# Patient Record
Sex: Female | Born: 1975 | Marital: Single | Smoking: Never smoker
Health system: Southern US, Community
[De-identification: ages and names within clinical notes are randomized; demographics above are authoritative.]

## PROBLEM LIST (undated history)

## (undated) DIAGNOSIS — N920 Excessive and frequent menstruation with regular cycle: Secondary | ICD-10-CM

## (undated) DIAGNOSIS — N83209 Unspecified ovarian cyst, unspecified side: Secondary | ICD-10-CM

## (undated) HISTORY — PX: LAPAROSCOPIC ABDOMINAL EXPLORATION: SHX6249

## (undated) HISTORY — DX: Unspecified ovarian cyst, unspecified side: N83.209

## (undated) HISTORY — DX: Excessive and frequent menstruation with regular cycle: N92.0

---

## 2014-06-13 ENCOUNTER — Inpatient Hospital Stay (HOSPITAL_COMMUNITY)
Admission: AD | Admit: 2014-06-13 | Discharge: 2014-06-13 | Disposition: A | Discharge status: Home / Self Care | Payer: Self-pay | Source: Ambulatory Visit | Attending: Family Medicine | Admitting: Family Medicine

## 2014-06-13 ENCOUNTER — Encounter (HOSPITAL_COMMUNITY): Payer: Self-pay | Admitting: *Deleted

## 2014-06-13 ENCOUNTER — Inpatient Hospital Stay (HOSPITAL_COMMUNITY): Payer: Self-pay

## 2014-06-13 DIAGNOSIS — Z9851 Tubal ligation status: Secondary | ICD-10-CM | POA: Insufficient documentation

## 2014-06-13 DIAGNOSIS — R1031 Right lower quadrant pain: Secondary | ICD-10-CM

## 2014-06-13 DIAGNOSIS — N83201 Unspecified ovarian cyst, right side: Secondary | ICD-10-CM

## 2014-06-13 DIAGNOSIS — N8320 Unspecified ovarian cysts: Secondary | ICD-10-CM | POA: Insufficient documentation

## 2014-06-13 DIAGNOSIS — N920 Excessive and frequent menstruation with regular cycle: Secondary | ICD-10-CM | POA: Insufficient documentation

## 2014-06-13 LAB — CBC WITH DIFFERENTIAL/PLATELET
BASOS ABS: 0 10*3/uL (ref 0.0–0.1)
Basophils Relative: 1 % (ref 0–1)
Eosinophils Absolute: 0.4 10*3/uL (ref 0.0–0.7)
Eosinophils Relative: 5 % (ref 0–5)
HEMATOCRIT: 40.4 % (ref 36.0–46.0)
HEMOGLOBIN: 13.9 g/dL (ref 12.0–15.0)
LYMPHS PCT: 31 % (ref 12–46)
Lymphs Abs: 2.7 10*3/uL (ref 0.7–4.0)
MCH: 30.3 pg (ref 26.0–34.0)
MCHC: 34.4 g/dL (ref 30.0–36.0)
MCV: 88 fL (ref 78.0–100.0)
MONOS PCT: 8 % (ref 3–12)
Monocytes Absolute: 0.7 10*3/uL (ref 0.1–1.0)
NEUTROS PCT: 55 % (ref 43–77)
Neutro Abs: 4.8 10*3/uL (ref 1.7–7.7)
Platelets: 227 10*3/uL (ref 150–400)
RBC: 4.59 MIL/uL (ref 3.87–5.11)
RDW: 13.4 % (ref 11.5–15.5)
WBC: 8.6 10*3/uL (ref 4.0–10.5)

## 2014-06-13 LAB — URINALYSIS, ROUTINE W REFLEX MICROSCOPIC
Bilirubin Urine: NEGATIVE
Glucose, UA: NEGATIVE mg/dL
Hgb urine dipstick: NEGATIVE
Ketones, ur: NEGATIVE mg/dL
Leukocytes, UA: NEGATIVE
NITRITE: NEGATIVE
Protein, ur: NEGATIVE mg/dL
SPECIFIC GRAVITY, URINE: 1.02 (ref 1.005–1.030)
UROBILINOGEN UA: 0.2 mg/dL (ref 0.0–1.0)
pH: 6 (ref 5.0–8.0)

## 2014-06-13 LAB — WET PREP, GENITAL
TRICH WET PREP: NONE SEEN
Yeast Wet Prep HPF POC: NONE SEEN

## 2014-06-13 LAB — POCT PREGNANCY, URINE: Preg Test, Ur: NEGATIVE

## 2014-06-13 MED ORDER — ONDANSETRON HCL 4 MG PO TABS
ORAL_TABLET | ORAL | Status: AC
  Administered 2014-06-13: 8 mg
  Filled 2014-06-13: qty 1

## 2014-06-13 MED ORDER — ONDANSETRON HCL 4 MG PO TABS
ORAL_TABLET | ORAL | Status: AC
  Filled 2014-06-13: qty 1

## 2014-06-13 MED ORDER — ONDANSETRON HCL 4 MG PO TABS: 4.0000 mg | ORAL_TABLET | Freq: Four times a day (QID) | ORAL | Status: AC

## 2014-06-13 MED ORDER — ONDANSETRON 8 MG PO TBDP: 8.0000 mg | ORAL_TABLET | Freq: Once | ORAL | Status: DC

## 2014-06-13 MED ORDER — HYDROMORPHONE HCL 1 MG/ML IJ SOLN
1.0000 mg | Freq: Once | INTRAMUSCULAR | Status: AC
  Administered 2014-06-13: 1 mg via INTRAMUSCULAR
  Filled 2014-06-13: qty 1

## 2014-06-13 MED ORDER — OXYCODONE-ACETAMINOPHEN 5-325 MG PO TABS: 1.0000 | ORAL_TABLET | Freq: Four times a day (QID) | ORAL | Status: AC | PRN

## 2014-06-13 NOTE — MAU Provider Note (Signed)
History     CSN: [id number]  Arrival date and time: 06/13/14 1736   First Provider Initiated Contact with Patient 06/13/14 1946      Chief Complaint  Patient presents with  . Abdominal Pain  . Vaginal Bleeding  . Vaginal Itching   HPI  [name] is a 39 y.o. [id number] who presents to MAU today with complaint of suprapubic pain. She rates her pain at 9/10 now. She states a history of heavy periods lasting 8-10 days on average monthly since she had BTL in [facility] 06/08/13. She denies vaginal bleeding today. She states LMP was 05/28/14-06/02/14. She denies N/V/D or constipation, dysuria or fever.   OB History    Gravida Para Term Preterm AB TAB SAB Ectopic Multiple Living   0 1 0 1 0  4      History reviewed. No pertinent past medical history.  Past Surgical History  Procedure Laterality Date  . Cesarean section    . Laparoscopic abdominal exploration      ONE  TWIN WAS ECTOPIC    History reviewed. No pertinent family history.  History  Substance Use Topics  . Smoking status: Never Smoker   . Smokeless tobacco: Not on file  . Alcohol Use: No    Allergies: No Known Allergies  Prescriptions prior to admission  Medication Sig Dispense Refill Last Dose  . albuterol (PROVENTIL HFA;VENTOLIN HFA) 108 (90 BASE) MCG/ACT inhaler Inhale 2 puffs into the lungs every 6 (six) hours as needed for wheezing or shortness of breath.   Rescue    Review of Systems  Constitutional: Negative for fever and malaise/fatigue.  Gastrointestinal: Positive for abdominal pain. Negative for nausea, vomiting, diarrhea and constipation.  Genitourinary: Negative for dysuria and urgency.       + vaginal discharge Neg - vaginal bleeding   Physical Exam   Blood pressure 139/98, pulse 76, temperature 98.3 F (36.8 C), temperature source Oral, resp. rate 20, weight 179 lb (81.194 kg), last menstrual period 05/28/2014.  Physical Exam  Constitutional: She is oriented to person,  place, and time. She appears well-developed and well-nourished. No distress.  HENT:  Head: Normocephalic.  Cardiovascular: Normal rate.   Respiratory: Effort normal.  GI: Soft. Bowel sounds are normal. She exhibits no distension and no mass. There is tenderness (moderate tenderness to palpation of the lower abdomen more prominent in the RLQ). There is no rebound and no guarding.  Genitourinary: Uterus is not enlarged and not tender. Cervix exhibits no motion tenderness, no discharge and no friability. Right adnexum displays tenderness. Right adnexum displays no mass. Left adnexum displays no mass and no tenderness. No bleeding in the vagina. Vaginal discharge (small amount of thin, white discharge noted) found.  Neurological: She is alert and oriented to person, place, and time.  Skin: Skin is warm and dry. No erythema.  Psychiatric: She has a normal mood and affect.   Results for orders placed or performed during the hospital encounter of 06/13/14 (from the past 24 hour(s))  Urinalysis, Routine w reflex microscopic     Status: None   Collection Time: 06/13/14  6:30 PM  Result Value Ref Range   Color, Urine YELLOW YELLOW   APPearance CLEAR CLEAR   Specific Gravity, Urine 1.020 1.005 - 1.030   pH 6.0 5.0 - 8.0   Glucose, UA NEGATIVE NEGATIVE mg/dL   Hgb urine dipstick NEGATIVE NEGATIVE   Bilirubin Urine NEGATIVE NEGATIVE   Ketones, ur NEGATIVE NEGATIVE mg/dL   Protein,  ur NEGATIVE NEGATIVE mg/dL   Urobilinogen, UA 0.2 0.0 - 1.0 mg/dL   Nitrite NEGATIVE NEGATIVE   Leukocytes, UA NEGATIVE NEGATIVE  Pregnancy, urine POC     Status: None   Collection Time: 06/13/14  6:45 PM  Result Value Ref Range   Preg Test, Ur NEGATIVE NEGATIVE  CBC with Differential     Status: None   Collection Time: 06/13/14  6:58 PM  Result Value Ref Range   WBC 8.6 4.0 - 10.5 K/uL   RBC 4.59 3.87 - 5.11 MIL/uL   Hemoglobin 13.9 12.0 - 15.0 g/dL   HCT 16.1 09.6 - 04.5 %   MCV 88.0 78.0 - 100.0 fL   MCH 30.3  26.0 - 34.0 pg   MCHC 34.4 30.0 - 36.0 g/dL   RDW 40.9 81.1 - 91.4 %   Platelets 227 150 - 400 K/uL   Neutrophils Relative % 55 43 - 77 %   Neutro Abs 4.8 1.7 - 7.7 K/uL   Lymphocytes Relative 31 12 - 46 %   Lymphs Abs 2.7 0.7 - 4.0 K/uL   Monocytes Relative 8 3 - 12 %   Monocytes Absolute 0.7 0.1 - 1.0 K/uL   Eosinophils Relative 5 0 - 5 %   Eosinophils Absolute 0.4 0.0 - 0.7 K/uL   Basophils Relative 1 0 - 1 %   Basophils Absolute 0.0 0.0 - 0.1 K/uL  Wet prep, genital     Status: Abnormal   Collection Time: 06/13/14  8:00 PM  Result Value Ref Range   Yeast Wet Prep HPF POC NONE SEEN NONE SEEN   Trich, Wet Prep NONE SEEN NONE SEEN   Clue Cells Wet Prep HPF POC FEW (A) NONE SEEN   WBC, Wet Prep HPF POC FEW (A) NONE SEEN   US Transvaginal Non-ob  06/13/2014   CLINICAL DATA:  Acute onset of right lower quadrant abdominal pain. Initial encounter.  EXAM: TRANSABDOMINAL AND TRANSVAGINAL ULTRASOUND OF PELVIS  TECHNIQUE: Both transabdominal and transvaginal ultrasound examinations of the pelvis were performed. Transabdominal technique was performed for global imaging of the pelvis including uterus, ovaries, adnexal regions, and pelvic cul-de-sac. It was necessary to proceed with endovaginal exam following the transabdominal exam to visualize the uterus and ovaries in greater detail.  COMPARISON:  None  FINDINGS: Uterus  Measurements: 8.2 x 3.5 x 4.8 cm. No fibroids or other mass visualized.  Endometrium  Thickness: 0.3 cm.  No focal abnormality visualized.  Right ovary  Measurements: 6.5 x 4.1 x 4.9 cm. A complex cyst is noted at the right ovary, measuring 5.1 x 3.2 x 4.6 cm, with partial organization and a lace-like pattern, compatible with a hemorrhagic cyst. Limited Doppler evaluation demonstrates normal color Doppler blood flow with regard to the normal right ovarian tissue.  Left ovary  Measurements: 2.1 x 1.2 x 1.6 cm. Normal appearance/no adnexal mass.  Other findings  No free fluid is seen  within the pelvic cul-de-sac. The transvaginal evaluation is limited due to pain during the examination.  IMPRESSION: 1. 5.1 cm right adnexal hemorrhagic cyst noted. 2. Otherwise unremarkable pelvic ultrasound. No evidence for ovarian torsion.   Electronically Signed   By: Roanna Raider M.D.   On: 06/13/2014 21:09   US Pelvis Complete  06/13/2014   CLINICAL DATA:  Acute onset of right lower quadrant abdominal pain. Initial encounter.  EXAM: TRANSABDOMINAL AND TRANSVAGINAL ULTRASOUND OF PELVIS  TECHNIQUE: Both transabdominal and transvaginal ultrasound examinations of the pelvis were performed. Transabdominal technique was performed  for global imaging of the pelvis including uterus, ovaries, adnexal regions, and pelvic cul-de-sac. It was necessary to proceed with endovaginal exam following the transabdominal exam to visualize the uterus and ovaries in greater detail.  COMPARISON:  None  FINDINGS: Uterus  Measurements: 8.2 x 3.5 x 4.8 cm. No fibroids or other mass visualized.  Endometrium  Thickness: 0.3 cm.  No focal abnormality visualized.  Right ovary  Measurements: 6.5 x 4.1 x 4.9 cm. A complex cyst is noted at the right ovary, measuring 5.1 x 3.2 x 4.6 cm, with partial organization and a lace-like pattern, compatible with a hemorrhagic cyst. Limited Doppler evaluation demonstrates normal color Doppler blood flow with regard to the normal right ovarian tissue.  Left ovary  Measurements: 2.1 x 1.2 x 1.6 cm. Normal appearance/no adnexal mass.  Other findings  No free fluid is seen within the pelvic cul-de-sac. The transvaginal evaluation is limited due to pain during the examination.  IMPRESSION: 1. 5.1 cm right adnexal hemorrhagic cyst noted. 2. Otherwise unremarkable pelvic ultrasound. No evidence for ovarian torsion.   Electronically Signed   By: Roanna Raider M.D.   On: 06/13/2014 21:09    MAU Course  Procedures None  MDM UA, wet prep, GC/Chlamydia, CBC, HIV and [place] today 1 mg Dilaudid IM given in  MAU 8 mg Zofran PO given for nausea after Dilaudid Discussed patient and results with Dr. Adrian Blackwater. Follow-up is appropriate in 2-3 weeks.  Assessment and Plan  A: 5.1 cm right hemorrhagic cyst Menorrhagia s/p BTL  P: Discharge home Rx for Percocet and Zofran given to patient Patient referred to [facility] for follow-up in 2-3 weeks. They will call her with an appointment date/time.  Patient may return to MAU as needed or if her condition were to change or worsen   Marny Lowenstein, PA-C  06/13/2014, 9:10 PM

## 2014-06-13 NOTE — Discharge Instructions (Signed)
Quiste ovárico °(Ovarian Cyst) °Un quiste ovárico es un saco lleno de líquido o sangre. Este saco está unido al ovario. Algunos quistes desaparecen solos. Otros quistes necesitan tratamiento.  °CUIDADOS EN EL HOGAR  °· Solo tome los medicamentos que le haya indicado su médico. °· Concurra a las consultas de control con el médico, según las indicaciones. °· Hágase exámenes pélvicos regulares y pruebas de Papanicolaou. °SOLICITE AYUDA SI: °· Sus períodos se atrasan o son irregulares o dolorosos. °· Sus períodos cesan. °· Siente dolor en el vientre (abdominal) o en la pelvis que no desaparece. °· El abdomen se agranda o se inflama hincha. °· Tiene dificultades para orinar (vaciar por completo la vejiga). °· Siente presión en la vejiga. °· Siente dolor durante las relaciones sexuales. °· Siente distensión, presión o molestias en el estómago. °· Pierde peso sin ningún motivo. °· Se siente mal constantemente. °· Tiene dificultades para defecar (estreñimiento). °· No tiene ganas de comer. °· Le aparecen granos (acné). °· Nota un aumento del vello corporal y facial. °· Sube de peso sin motivo. °· Sospecha que está embarazada. °SOLICITE AYUDA DE INMEDIATO SI:  °· El dolor en el vientre empeora. °· Tiene malestar estomacal (náuseas) y vomita. °· Le sube la fiebre rápidamente. °· Le duele el estómago mientras defeca. °· Los períodos menstruales son más abundantes de lo habitual. °ASEGÚRESE DE QUE:  °· Comprende estas instrucciones. °· Controlará su afección. °· Recibirá ayuda de inmediato si no mejora o si empeora. °Document Released: 03/17/2013 °ExitCare® Patient Information ©2015 [employer]. This information is not intended to replace advice given to you by your health care provider. Make sure you discuss any questions you have with your health care provider. ° °

## 2014-06-13 NOTE — MAU Note (Signed)
Urine in lab 

## 2014-06-13 NOTE — MAU Note (Addendum)
Strong Pain in lower abd for past 3 days, esp on rt side.  Vag d/c for 3 months, has a lot of itching and burning.  In 2014, she had tubal ligation.  Since then her periods are every month and last  2 wks.  Is bleeding a lot.  Dizzy at times

## 2014-06-14 LAB — HIV ANTIBODY (ROUTINE TESTING W REFLEX): HIV 1&2 Ab, 4th Generation: NONREACTIVE

## 2014-06-15 LAB — GC/CHLAMYDIA PROBE AMP
CT PROBE, AMP APTIMA: NEGATIVE
GC PROBE AMP APTIMA: NEGATIVE

## 2014-07-18 ENCOUNTER — Ambulatory Visit (INDEPENDENT_AMBULATORY_CARE_PROVIDER_SITE_OTHER): Payer: Self-pay | Admitting: Obstetrics & Gynecology

## 2014-07-18 ENCOUNTER — Encounter: Payer: Self-pay | Admitting: Obstetrics & Gynecology

## 2014-07-18 ENCOUNTER — Telehealth: Payer: Self-pay | Admitting: *Deleted

## 2014-07-18 VITALS — BP 129/84 | HR 72 | Temp 98.6°F | Wt 179.7 lb

## 2014-07-18 DIAGNOSIS — N760 Acute vaginitis: Secondary | ICD-10-CM

## 2014-07-18 DIAGNOSIS — N920 Excessive and frequent menstruation with regular cycle: Secondary | ICD-10-CM

## 2014-07-18 MED ORDER — MEGESTROL ACETATE 20 MG PO TABS: 40.0000 mg | ORAL_TABLET | Freq: Two times a day (BID) | ORAL | Status: DC

## 2014-07-18 MED ORDER — MEGESTROL ACETATE 20 MG PO TABS: 40.0000 mg | ORAL_TABLET | Freq: Two times a day (BID) | ORAL | Status: AC

## 2014-07-18 MED ORDER — METRONIDAZOLE 500 MG PO TABS: 500.0000 mg | ORAL_TABLET | Freq: Two times a day (BID) | ORAL | Status: AC

## 2014-07-18 NOTE — Addendum Note (Signed)
Addended by: Jaynie CollinsANYANWU, Maverick Patman A on: 07/18/2014 05:08 PM   Modules accepted: Orders

## 2014-07-18 NOTE — Progress Notes (Signed)
   CLINIC ENCOUNTER NOTE  History:  39 y.o. [id number] here today for discussion of hemorrhagic ovarian cyst noted on recent ED visit, also to discuss menorrhagia for a few months.  Patient is Spanish-speaking only, Spanish interpreter present for this encounter. Pain from cyst is improving.  Really wants to discuss menorrhagia management; also reports occasional fatigue and lightheadedness. During her ER visit, she reports itching and wet prep showed few clue cells; patient desires treatment.     The following portions of the patient's history were reviewed and updated as appropriate: allergies, current medications, past family history, past medical history, past social history, past surgical history and problem list. Normal pap in 2015 at outside clinic.  Review of Systems:  Pertinent items are noted in HPI.  Objective:   BP 129/84 mmHg  Pulse 72  Temp(Src) 98.6 F (37 C) (Oral)  Wt 179 lb 11.2 oz (81.511 kg)  LMP 07/17/2014 Physical Exam deferred  Labs and Imaging 06/13/2014   CLINICAL DATA:  Acute onset of right lower quadrant abdominal pain. Initial encounter.  EXAM: TRANSABDOMINAL AND TRANSVAGINAL ULTRASOUND OF PELVIS  TECHNIQUE: Both transabdominal and transvaginal ultrasound examinations of the pelvis were performed. Transabdominal technique was performed for global imaging of the pelvis including uterus, ovaries, adnexal regions, and pelvic cul-de-sac. It was necessary to proceed with endovaginal exam following the transabdominal exam to visualize the uterus and ovaries in greater detail.  COMPARISON:  None  FINDINGS: Uterus  Measurements: 8.2 x 3.5 x 4.8 cm. No fibroids or other mass visualized.  Endometrium  Thickness: 0.3 cm.  No focal abnormality visualized.  Right ovary  Measurements: 6.5 x 4.1 x 4.9 cm. A complex cyst is noted at the right ovary, measuring 5.1 x 3.2 x 4.6 cm, with partial organization and a lace-like pattern, compatible with a hemorrhagic cyst. Limited Doppler  evaluation demonstrates normal color Doppler blood flow with regard to the normal right ovarian tissue.  Left ovary  Measurements: 2.1 x 1.2 x 1.6 cm. Normal appearance/no adnexal mass.  Other findings  No free fluid is seen within the pelvic cul-de-sac. The transvaginal evaluation is limited due to pain during the examination.  IMPRESSION: 1. 5.1 cm right adnexal hemorrhagic cyst noted. 2. Otherwise unremarkable pelvic ultrasound. No evidence for ovarian torsion.   Electronically Signed   By: Roanna RaiderJeffery  Chang M.D.   On: 06/13/2014 21:09    Assessment & Plan:  Endometrial stripe 3 mm.  No need for follow up scan for hemorrhagic cyst for now. Discussed management options for abnormal uterine bleeding including oral progesterone, Depo Provera, Mirena IUD, endometrial ablation (Novasure/Hydrothermal Ablation) or hysterectomy as definitive surgical management.  Discussed risks and benefits of each method.   Patient desires Megace for now.  Printed patient education handouts were given to the patient to review at home.  Megace prescribed as needed for now,  bleeding precautions reviewed.  TSH, CBC, HgA1C (patient request) checked today; will follow up results and manage accordingly. Metronidazole prescribed for BV. Routine preventative health maintenance measures emphasized.   Jaynie CollinsUGONNA  Maddelynn Moosman, MD, FACOG Attending Obstetrician & Gynecologist Center for [facility], [facility]

## 2014-07-18 NOTE — Patient Instructions (Signed)
Ablacin del endometrio (Endometrial Ablation) En la ablacin del endometrio se extirpa el recubrimiento interno del tero (endometrio). Este es generalmente un tratamiento que se Scientist, water quality, en forma ambulatoria. La ablacin permite evitar una ciruga mayor, como la ciruga para extirpar el cuello del tero y Nurse, learning disability (histerectoma). Luego de la ablacin del endometrio, tendr poco o nada de flujo menstrual y no podr tener hijos. Sin embargo, si se encuentra en la etapa de la premenopausia, deber usar un mtodo confiable de control de la natalidad luego del procedimiento, ya que hay una pequea probabilidad de que ocurra un Media planner. Hay diferentes razones para llevar a cabo este procedimiento, ellas son:  Perodos abundantes.  Sangrado que causa anemia.  Sangrado irregular.  Fibromas que sangran en la superficie interior del tero, si no miden ms de 3 cm. Este procedimiento no debe realizarse si:  Desea tener hijos en el futuro.  Tiene clicos intensos durante el perodo menstrual.  Tiene clulas precancerosas o cancerosas en el tero.  Tuvo un embarazo reciente.  Est cursando la menopausia.  Fue sometida a Audiological scientist en el tero, como una operacin cesrea. INFORME A SU MDICO:  Cualquier alergia que tenga.  Todos los [employer] [place], incluyendo vitaminas, hierbas, gotas oftlmicas, cremas y medicamentos de venta libre.  Problemas previos que usted o los [employer] de su familia hayan tenido con el uso de anestsicos.  Enfermedades de [employer].  Cirugas previas.  Padecimientos mdicos. RIESGOS Y COMPLICACIONES  Generalmente, este es un procedimiento seguro. Sin embargo, Games developer procedimiento, pueden surgir complicaciones. Las complicaciones posibles son:  Perforacin del tero.  Hemorragias.  Infeccin en el tero, vejiga, o vagina.  Lesin en los rganos circundantes.  Ardelia Mems burbuja de aire en un pulmn (embolia de  aire).  Embarazo luego del procedimiento.  Si el procedimiento no resuelve el problema, ser necesaria una histerectoma.  Disminucin de la capacidad para Community education officer en el recubrimiento interno del tero. ANTES DEL PROCEDIMIENTO  Deber controlarse la membrana que recubre el tero para asegurarse de que no hay clulas cancerosas o precancerosas.  Le harn una ecografa para observar el tamao del tero y verificar si hay anormalidades.  Le darn medicamentos para Engineer, structural de la membrana que recubre el tero. PROCEDIMIENTO  Durante el procedimiento, el medico usar un instrumento llamado resectoscopio para ver el interior del tero. Hay diferentes formas de extirpar la membrana que cubre el tero.   Radiofrecuencia: en este mtodo se utiliza un aparato de radiofrecuencia, corriente elctrica alterna, para extirpar la membrana interna del tero.  Crioterapia: en este mtodo se [place] fro extremo para congelar la membrana del tero.  Lquido caliente: en este mtodo se utiliza solucin salina con el mismo objetivo.  Microondas: Se utilizan microondas de alta energa para calentar la membrana y extirparla.  Baln termal: consiste en insertar un catter con un baln en la punta dentro del tero. La punta del baln contiene un lquido caliente que elimina la membrana que tapiza el [place]. DESPUS DEL PROCEDIMIENTO  Despus del procedimiento, no tenga relaciones sexuales ni inserte nada en la vagina hasta que su mdico la autorice. Despus del procedimiento podr experimentar:  Clicos.  Flujo vaginal.  Ganas de orinar con frecuencia. Document Released: 01/27/2013 Document Revised: 03/17/2013 [facility] Patient Information 2015 [place]. This information is not intended to replace advice given to you by your health care provider. Make sure you discuss any questions you have with your health care provider.  Informacin sobre Geologist, engineering (Hysterectomy  Information)  La histerectoma es una ciruga que se realiza para extirpar el tero. Esta ciruga se puede realizar para tratar varios problemas mdicos. Despus de la ciruga, no volver a IT consultant. Adems, debido a esta ciruga no podr quedar embarazada (estril). Asimismo, durante esta ciruga se podrn extirpar las trompas de falopio y los ovarios (salpingooforectoma bilateral).  RAZONES PARA REALIZAR UNA HISTERECTOMA  Sangrado persistente, anormal.  Dolor o infeccin persistente (crnico) en la pelvis.  El endometrio comienza a desarrollarse fuera del tero (endometriosis).  El endometrio comienza a desarrollarse en el msculo del tero (adenomiosis).  El tero desciende hacia la vagina (prolapso de los rganos de la pelvis).  Neoplasias benignas en el tero (fibromas uterinos) que provocan sntomas.  Clulas precancerosas.  Cncer cervical o cncer uterino. TIPOS DE HISTERECTOMA  Histerectoma supracervical: en este tipo de intervencin, se extirpa la parte superior del tero, pero no el cuello del tero.  Histerectoma total: se extirpan el tero y el cuello del tero.  Histerectoma radical: se extirpan el tero, el cuello del tero y los tejidos fibrosos que sostienen al tero en su lugar en la pelvis (parametrio). FORMAS EN QUE SE PUEDE REALIZAR UNA HISTERECTOMA  Histerectoma abdominal: se realiza un corte quirrgico grande (incisin) en el abdomen. Se extirpa el tero a travs de esta incisin.  Histerectoma vaginal: se realiza una incisin en la vagina. Se extirpa el tero a travs de esta incisin. No hay incisiones abdominales.  Histerectoma laparoscpica convencional: se realizan tres o cuatro incisiones pequeas en el abdomen. Se inserta un tubo delgado y luminoso con una cmara (laparoscopio) en una de las incisiones. A travs del resto de las incisiones se insertan otros instrumentos quirrgicos. El tero se corta en trozos pequeos. Las  piezas pequeas se eliminan a travs de las incisiones, o se retiran a travs de la vagina.  Histerectoma vaginal asistida por laparoscopia (HVAL): se realizan tres o cuatro incisiones pequeas en el abdomen. Parte de la ciruga se realiza por va laparoscpica y parte por va vaginal. El tero se extirpa a travs de la vagina.  Histerectoma laparoscpica asistida por robot: se insertan un laparoscopio y otros instrumentos quirrgicos en 3 o 4 incisiones pequeas en el abdomen. Se utiliza un dispositivo controlado por computadora para que el cirujano vea una imagen en 3D que lo ayuda a Chief Operating Officer los instrumentos quirrgicos. Esto permite movimientos ms precisos de los instrumentos quirrgicos. El tero se corta en trozos pequeos y se retira a travs de las incisiones o se elimina a travs de la vagina. RIESGOS Y COMPLICACIONES  Las posibles complicaciones asociadas con este procedimiento incluyen las siguientes:  Hemorragia y riesgos de Pension scheme manager una transfusin sangunea. Infrmele al mdico si no quiere recibir ningn tipo de hemoderivados.  Cogulos de [employer] piernas o los pulmones.  Infeccin.  Lesin en los rganos circundantes.  Problemas o efectos secundarios relacionados con la anestesia.  Transformacin de cualquiera de las otras tcnicas en una histerectoma abdominal. QU ESPERAR DESPUES DE UNA HISTERECTOMA  Le darn medicamentos para el dolor.  Necesitar que alguien Surveyor, mining con usted durante los primeros 3 a 5 das despus de que regrese a Pensions consultant.  Tendr que ver al cirujano para un seguimiento 2 a 4 semanas despus de la ciruga para evaluar su progreso.  Posiblemente tenga sntomas tempranos de menopausia, como sofocos, sudoracin nocturna e insomnio.  Si le han realizado una histerectoma debido a un problema que no era cncer ni  una afeccin que poda causar cncer, ya no necesitar una prueba de Papanicolaou. Sin embargo, si ya no necesita hacerse un  Papanicolau, es una buena idea hacerse un examen regularmente para asegurarse de que no hay otros problemas. Document Released: 05/27/2005 Document Revised: 03/17/2013 [facility] Patient Information 2015 [place], [place]. This information is not intended to replace advice given to you by your health care provider. Make sure you discuss any questions you have with your health care provider.  Quiste ovrico (Ovarian Cyst) Un quiste ovrico es una bolsa llena de lquido que se forma en el ovario. Los ovarios son los rganos pequeos que producen vulos en las mujeres. Se pueden formar varios tipos de SYSCOquistes en los ovarios. Katha HammingLa mayora no son cancerosos. Muchos de ellos no causan problemas y con frecuencia desaparecen solos. Algunos pueden provocar sntomas y requerir TEFL teachertratamiento. Los tipos ms comunes de quistes ovricos son los siguientes:  Quistes funcionales: estos quistes pueden aparecer todos los meses durante el ciclo menstrual. Esto es normal. Estos quistes suelen desaparecer con el prximo ciclo menstrual si la mujer no queda embarazada. En general, los quistes funcionales no tienen sntomas.  Endometriomas: estos quistes se forman a partir del tejido que recubre el tero. Tambin se denominan "quistes de chocolate" porque se llenan de sangre que se vuelve marrn. Este tipo de quiste puede Psychologist, counsellingprovocar dolor en la zona inferior del abdomen durante la relacin sexual y con el perodo menstrual.  Cistoadenomas: este tipo se desarrolla a partir de las clulas que se [place] en el exterior del ovario. Estos quistes pueden ser muy grandes y causar dolor en la zona inferior del abdomen y durante la relacin sexual. Cleda Clarksste tipo de quiste puede girar sobre s mismo, cortar el suministro de Retail buyersangre y causar un dolor intenso. Tambin se puede romper con facilidad y Physicist, medicalprovocar mucho dolor.  Quistes dermoides: este tipo de quiste a veces se encuentra en ambos ovarios. Estos quistes pueden AES Corporationcontener diferentes tipos de  tejidos del organismo, como piel, dientes, pelo o TEFL teachercartlago. Generalmente no tienen sntomas, a menos que sean [address]muy grandes.  Quistes tecalutenicos: aparecen cuando se produce demasiada cantidad de cierta hormona (gonadotropina corinica humana) que estimula en exceso al ovario para que produzca vulos. Esto es ms frecuente despus de procedimientos que ayudan a la concepcin de un beb (fertilizacin in vitro). CAUSAS   Los medicamentos para la fertilidad pueden provocar una afeccin mediante la cual se forman mltiples quistes de gran tamao en los ovarios. Esta se denomina sndrome de hiperestimulacin ovrica.  El sndrome del ovario poliqustico es una afeccin que puede causar desequilibrios hormonales, los cuales pueden dar como resultado quistes ovricos no funcionales. SIGNOS Y SNTOMAS  Muchos quistes ovricos no causan sntomas. Si se presentan sntomas, stos pueden ser:  Dolor o molestias en la pelvis.  Dolor en la parte baja del abdomen.  Dolor durante las The St. Paul Travelersrelaciones sexuales.  Aumento del permetro abdominal (hinchazn).  Perodos menstruales anormales.  Aumento del The TJX Companiesdolor en los perodos Oaklandmenstruales.  Cese de los perodos menstruales sin estar embarazada. DIAGNSTICO  Estos quistes se descubren comnmente durante un examen de rutina o una exploracin ginecolgica anual. Es posible que se ordenen otros estudios para obtener ms informacin sobre el [place]iste. Estos estudios pueden ser:  Regulatory affairs officercografas.  Radiografas de la pelvis.  Tomografa computada.  Resonancia magntica.  Anlisis de Bentleysangre. TRATAMIENTO  Muchos de los quistes ovricos desaparecen por s solos, sin tratamiento. Es probable que el mdico quiera controlar el quiste regularmente durante 2 o 3meses para ver  si se produce algn cambio. En el caso de las mujeres en la menopausia, es particularmente importante controlar de cerca al quiste ya que el ndice de cncer de ovario en las mujeres menopusicas es  ms alto. Cuando se requiere TEFL teacher, este puede incluir cualquiera de los siguientes:  Un procedimiento para drenar el quiste (aspiracin). Esto se puede realizar [employer] uso de [place] grande y War. Tambin se puede hacer a travs de un procedimiento laparoscpico, En este procedimiento, se inserta un tubo delgado que emite luz y que tiene una pequea cmara en un extremo (laparoscopio) a travs de una pequea incisin.  Ciruga para extirpar el quiste completo. Esto se puede realizar mediante una ciruga laparoscpica o Neomia Dear ciruga abierta, la cual implica realizar una incisin ms grande en la parte inferior del abdomen.  Tratamiento hormonal o pldoras anticonceptivas. Estos mtodos a veces se usan para ayudar a Barista. INSTRUCCIONES PARA EL CUIDADO EN EL HOGAR   Tome solo medicamentos de venta libre o recetados, segn las indicaciones del mdico.  Oceanographer a las consultas de control con su mdico segn las indicaciones.  Hgase exmenes plvicos regulares y pruebas de Papanicolaou. SOLICITE ATENCIN MDICA SI:   Los perodos se atrasan, son irregulares, dolorosos o cesan.  El dolor plvico o abdominal no desaparece.  El abdomen se agranda o se hincha.  Siente presin en la vejiga o no puede vaciarla completamente.  Siente dolor durante las [employer].  Tiene una sensacin de hinchazn, presin o Environmental manager.  Pierde peso sin razn aparente.  Siente un Engineer, maintenance (IT).  Est estreida.  Pierde el apetito.  Le aparece acn.  Nota un aumento del vello corporal y facial.  Lenora Boys de peso sin hacer modificaciones en su actividad fsica y en su dieta habitual.  Sospecha que est embarazada. SOLICITE ATENCIN MDICA DE INMEDIATO SI:   Siente cada vez ms dolor abdominal.  Tiene malestar estomacal (nuseas) y vomita.  Tiene fiebre que se presenta de [place] repentina.  Siente dolor abdominal al defecar.  Sus  perodos menstruales son ms abundantes que lo habitual. ASEGRESE DE QUE:   Comprende estas instrucciones.  Controlar su afeccin.  Recibir ayuda de inmediato si no mejora o si empeora. Document Released: 03/06/2005 Document Revised: 06/01/2013 [facility] Patient Information 2015 [place], [place]. This information is not intended to replace advice given to you by your health care provider. Make sure you discuss any questions you have with your health care provider.

## 2014-07-18 NOTE — Telephone Encounter (Signed)
[facility] pharmacy called to clarify prescription for megace- discussed with Dr. Macon LargeAnyanwu and she sent in new prescription. Called [facility] and notified them megace prescription has been resent- new order megace 20mg  - take 2 tablets bid . Can increase to 2 tablets TID in the event of heavy bleeding.

## 2014-07-19 ENCOUNTER — Telehealth: Payer: Self-pay | Admitting: *Deleted

## 2014-07-19 LAB — CBC
HEMATOCRIT: 38.9 % (ref 36.0–46.0)
Hemoglobin: 13.2 g/dL (ref 12.0–15.0)
MCH: 29.9 pg (ref 26.0–34.0)
MCHC: 33.9 g/dL (ref 30.0–36.0)
MCV: 88.2 fL (ref 78.0–100.0)
MPV: 10.5 fL (ref 8.6–12.4)
Platelets: 243 10*3/uL (ref 150–400)
RBC: 4.41 MIL/uL (ref 3.87–5.11)
RDW: 14.2 % (ref 11.5–15.5)
WBC: 7 10*3/uL (ref 4.0–10.5)

## 2014-07-19 LAB — HEMOGLOBIN A1C
Hgb A1c MFr Bld: 5.4 % (ref <5.7)
Mean Plasma Glucose: 108 mg/dL (ref <117)

## 2014-07-19 LAB — TSH: TSH: 1.423 u[IU]/mL (ref 0.350–4.500)

## 2014-07-19 NOTE — Telephone Encounter (Signed)
Pt called and left message that she has a problem with her medicine. She will need to be called back with spanish interpreter.

## 2014-07-20 NOTE — Telephone Encounter (Signed)
Called patient with Desiree BlacksmithDori, she just wanted to know how long to take Megace. i advised that she continue to take it and decide based on her last visit what the future plan would be ie. Iud, surgery, injection. Pt to make appointment in a few months to decide. Pt also informed of lab results.

## 2014-07-20 NOTE — Telephone Encounter (Addendum)
Called pt with Interpreter - Desiree Blake. Unable to leave message as voicemail has not been set up yet.

## 2014-10-12 ENCOUNTER — Ambulatory Visit (INDEPENDENT_AMBULATORY_CARE_PROVIDER_SITE_OTHER): Payer: Self-pay | Admitting: Obstetrics & Gynecology

## 2014-10-12 ENCOUNTER — Encounter: Payer: Self-pay | Admitting: Obstetrics & Gynecology

## 2014-10-12 VITALS — BP 118/73 | HR 77 | Temp 97.9°F | Resp 20 | Ht 61.5 in | Wt 181.5 lb

## 2014-10-12 DIAGNOSIS — L309 Dermatitis, unspecified: Secondary | ICD-10-CM

## 2014-10-12 DIAGNOSIS — L292 Pruritus vulvae: Secondary | ICD-10-CM

## 2014-10-12 MED ORDER — TRIAMCINOLONE ACETONIDE 0.1 % EX CREA: 1.0000 "application " | TOPICAL_CREAM | Freq: Two times a day (BID) | CUTANEOUS | Status: AC

## 2014-10-12 NOTE — Patient Instructions (Signed)
Regrese a la clinica cuando tenga su cita. Si tiene problemas o preguntas, llama a la clinica o vaya a la sala de emergencia al [facility].    

## 2014-10-12 NOTE — Progress Notes (Signed)
CLINIC ENCOUNTER NOTE  History:  [age] y.o. [id number] here today for follow up of menorrhagia.  Patient is Spanish-speaking only, Spanish interpreter present for this encounter.  She did not like the Megace because "it took away her period".  Wants to go back to having periods.  Patient also reports dryness during intercourse; after intercourse she feels burning in the area and dyspareunia, also reports chronic vaginal discharge since the birth of her last child despite multiple doses of antifungals and antibiotics.   Also reports noticing a episode of right nipple bleeding and itching. No antecedent trauma to the breast.  No nipple drainage or other associated symptoms.  Past Medical History  Diagnosis Date  . Menorrhagia   . Ovarian cyst     Past Surgical History  Procedure Laterality Date  . Laparoscopic abdominal exploration      ONE  TWIN WAS ECTOPIC  . Cesarean section  2014   The following portions of the patient's history were reviewed and updated as appropriate: allergies, current medications, past family history, past medical history, past social history, past surgical history and problem list.   Health Maintenance:  Normal pap in 2015.  Review of Systems:  Pertinent items are noted in HPI. Comprehensive review of systems was otherwise negative.  Objective:  Physical Exam BP 118/73 mmHg  Pulse 77  Temp(Src) 97.9 F (36.6 C) (Oral)  Resp 20  Ht 5' 1.5" (1.562 m)  Wt 181 lb 8 oz (82.328 kg)  BMI 33.74 kg/m2  LMP 07/18/2014 CONSTITUTIONAL: Well-developed, well-nourished female in no acute distress.  HENT:  Normocephalic, atraumatic, External right and left ear normal. Oropharynx is clear and moist EYES: Conjunctivae and EOM are normal. Pupils are equal, round, and reactive to light. No scleral icterus.  NECK: Normal range of motion, supple, no masses.  Normal thyroid.  SKIN: Skin is warm and dry. No rash noted. Not diaphoretic. No erythema. No pallor. NEUROLGIC: Alert  and oriented to person, place, and time. Normal reflexes, muscle tone coordination. No cranial nerve deficit noted. PSYCHIATRIC: Normal mood and affect. Normal behavior. Normal judgment and thought content. CARDIOVASCULAR: Normal heart rate noted, regular rhythm RESPIRATORY: Clear to auscultation bilaterally. Effort and breath sounds normal, no problems with respiration noted. BREASTS: Symmetric in size. No masses, skin changes, nipple drainage, or lymphadenopathy. ABDOMEN: Soft, normal bowel sounds, no distention noted.  No tenderness, rebound or guarding.  PELVIC: Normal appearing external genitalia; normal appearing vaginal mucosa and cervix. Scant normal appearing discharge, wet prep obtained.  Normal uterine size, no other palpable masses, no uterine or adnexal tenderness. MUSCULOSKELETAL: Normal range of motion. No tenderness.  No cyanosis, clubbing, or edema.  2+ distal pulses.   Assessment & Plan:  Patient with multiple issues, but normal exam.   AUB - No further hormonal therapy for her menorrhagia.  Patient wants periods to resume, she was told that resumption of periods can take 3-6 months after hormonal therapy.  Bleeding precautions reviewed.  Had indepth discussion about the menstrual cycle with patient; she was under the impression that blood "backed up in her body" when she does not menstruate.  Vulvar itching -  Normal exam, wet prep obtained.  Advised trial of steroid cream given that she has had this condition for years, may have an inflammatory condition? Triamcinolone cream prescribed, will follow up results.  Also encouraged using water-based lubrication for intercourse.  Nipple itching/bleeding - Normal exam.  Topical steroid therapy also encouraged, will monitor her response   Total face-to-face time  with patient: 25  minutes. Over 50% of encounter was spent on counseling and coordination of care.   Jaynie CollinsUGONNA  Tamyrah Burbage, MD, FACOG Attending Obstetrician & Gynecologist Center  for Luc[facility] TechnologiesWomen's Healthcare, [facility]

## 2014-10-12 NOTE — Progress Notes (Signed)
Interpreter [name] present for encounter. Pt states she took Megace as directed until March 15.  She has not had menses since that time. She is concerned about not having a period. She is also having vaginal itching and discharge as well as itching and one episode of bleeding from Rt nipple.

## 2014-10-13 LAB — WET PREP, GENITAL
CLUE CELLS WET PREP: NONE SEEN
Trich, Wet Prep: NONE SEEN
WBC WET PREP: NONE SEEN
Yeast Wet Prep HPF POC: NONE SEEN

## 2015-05-12 IMAGING — US US PELVIS COMPLETE
1 series · 13 of 25 positions shown · non-contrast
Comparison: None

CLINICAL DATA: Acute onset of right lower quadrant abdominal pain.
Initial encounter.

EXAM:
TRANSABDOMINAL AND TRANSVAGINAL ULTRASOUND OF PELVIS
TECHNIQUE: Both transabdominal and transvaginal ultrasound examinations of the
pelvis were performed. Transabdominal technique was performed for
global imaging of the pelvis including uterus, ovaries, adnexal
regions, and pelvic cul-de-sac. It was necessary to proceed with
endovaginal exam following the transabdominal exam to visualize the
uterus and ovaries in greater detail.

[Series 1: us pelvis complete · 13 of 34 slices shown]
[im 1/34]
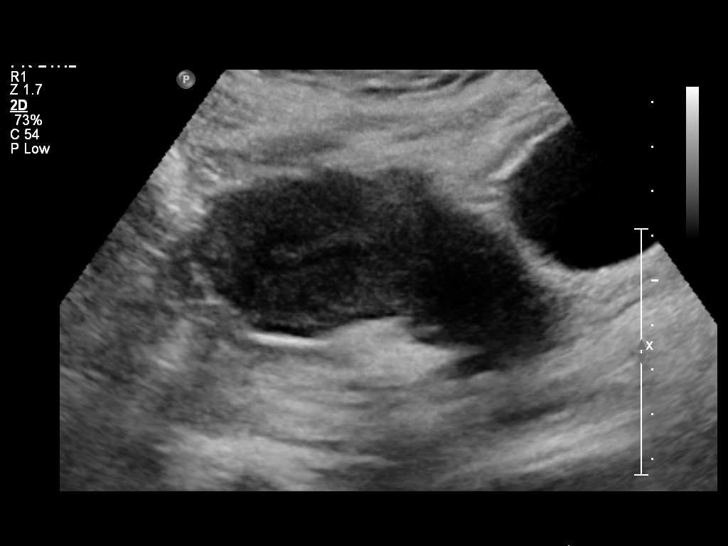
[im 3/34]
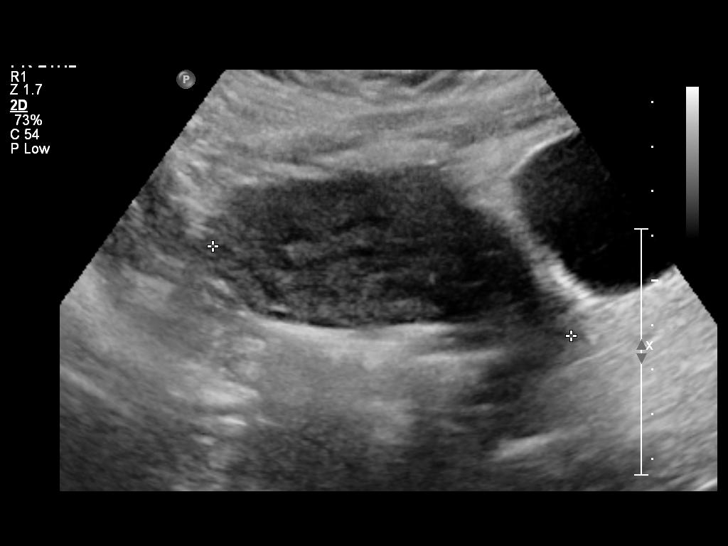
[im 6/34]
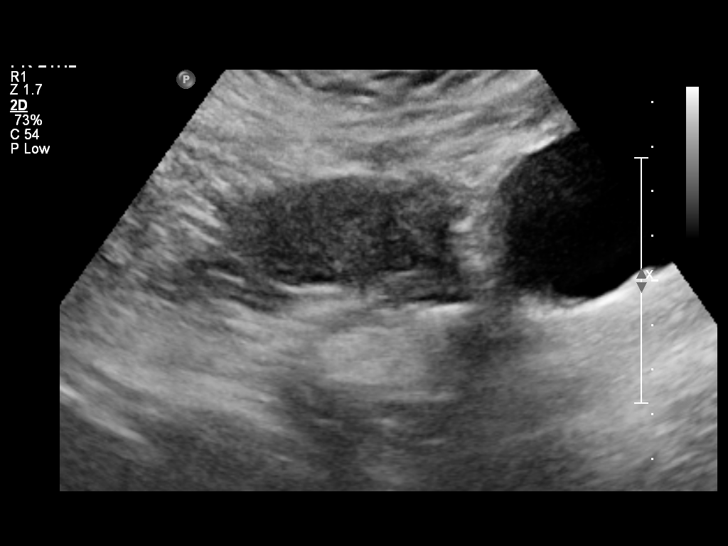
[im 9/34]
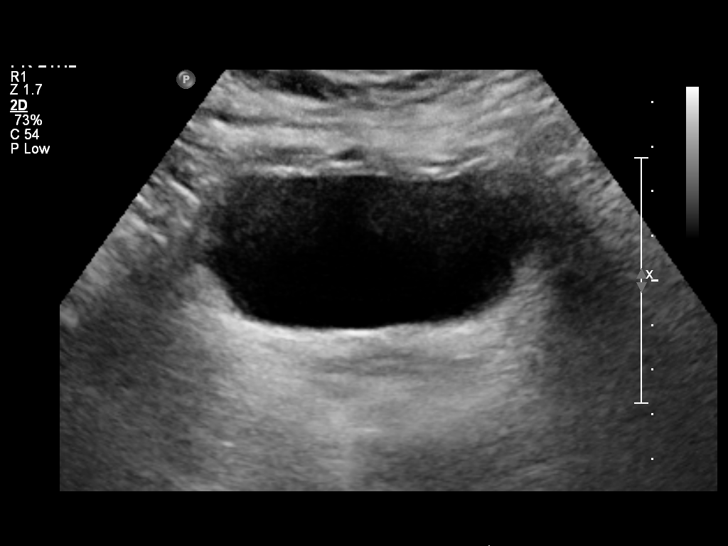
[im 12/34]
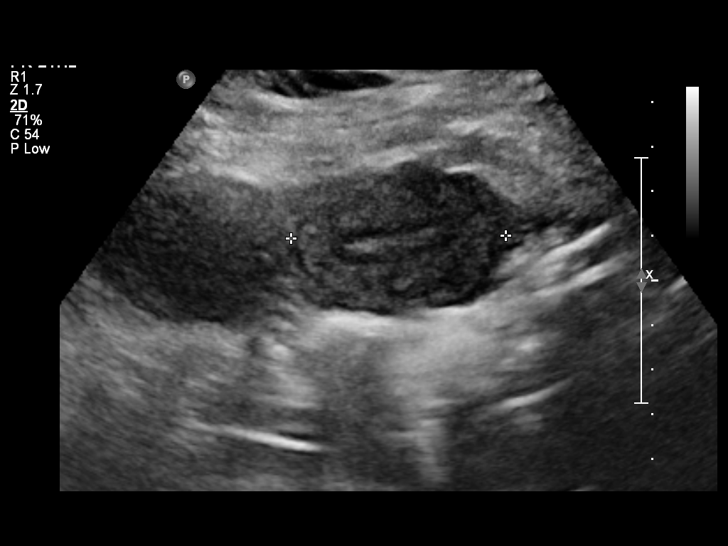
[im 14/34]
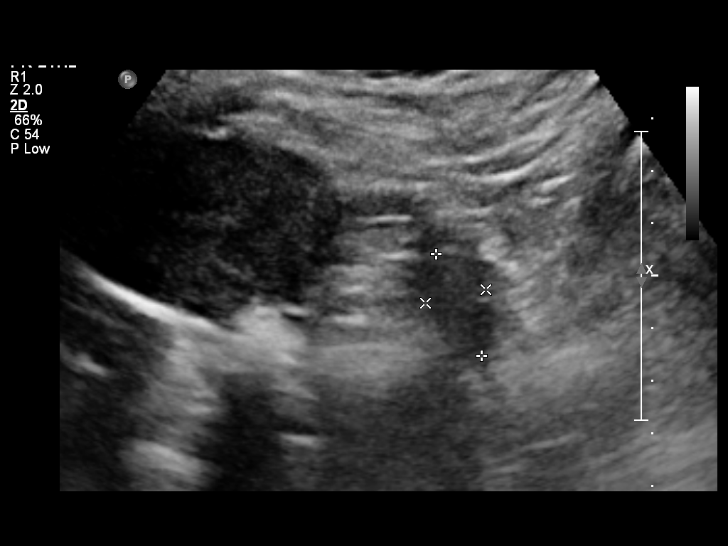
[im 17/34]
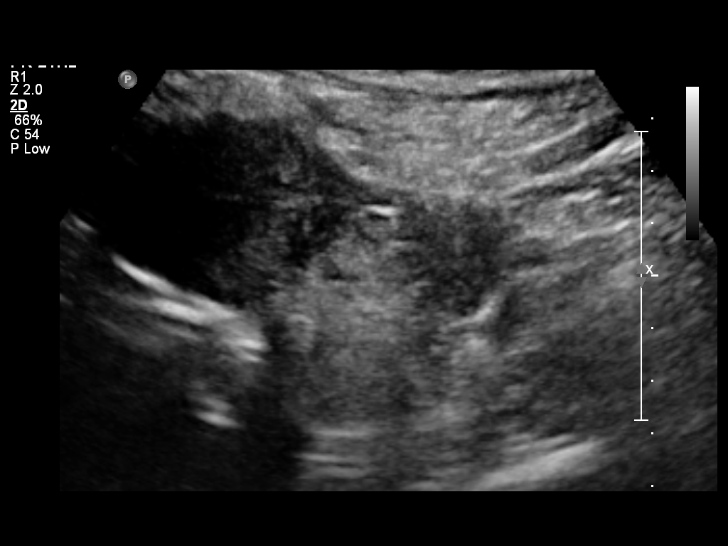
[im 20/34]
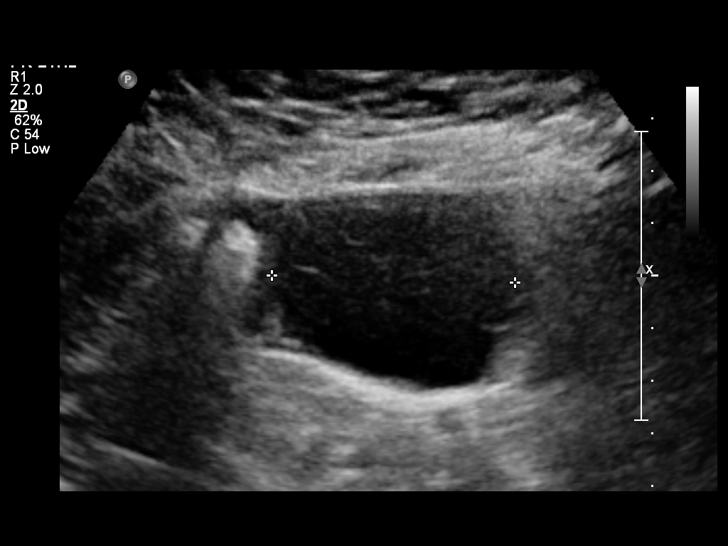
[im 23/34]
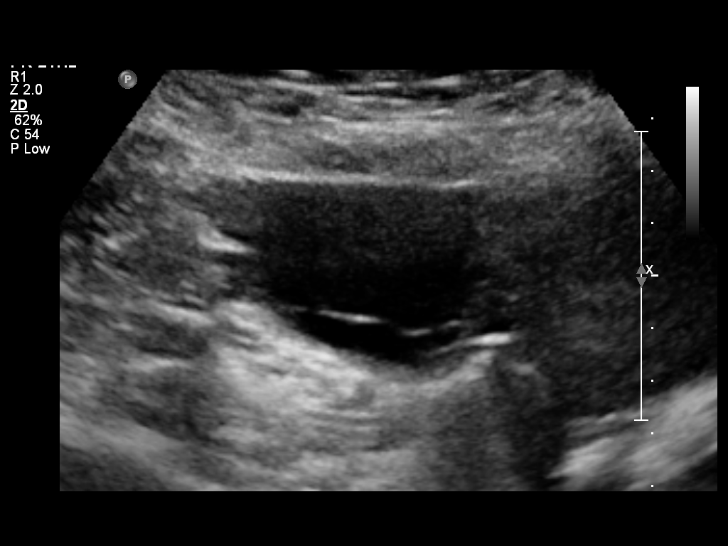
[im 25/34]
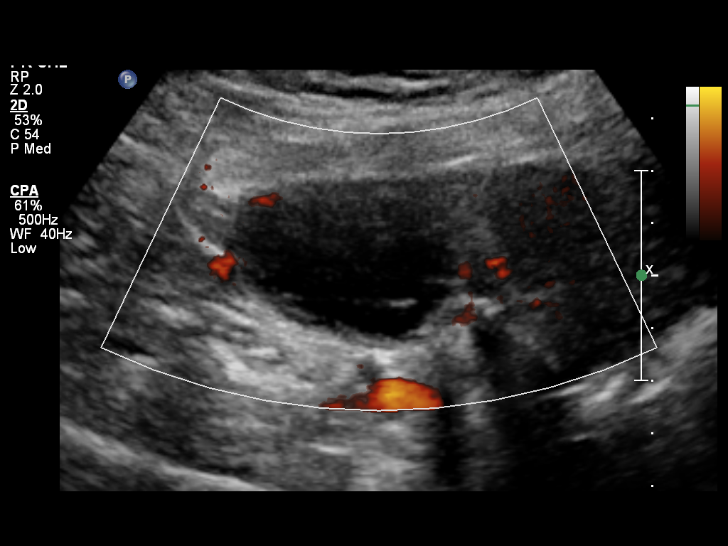
[im 28/34]
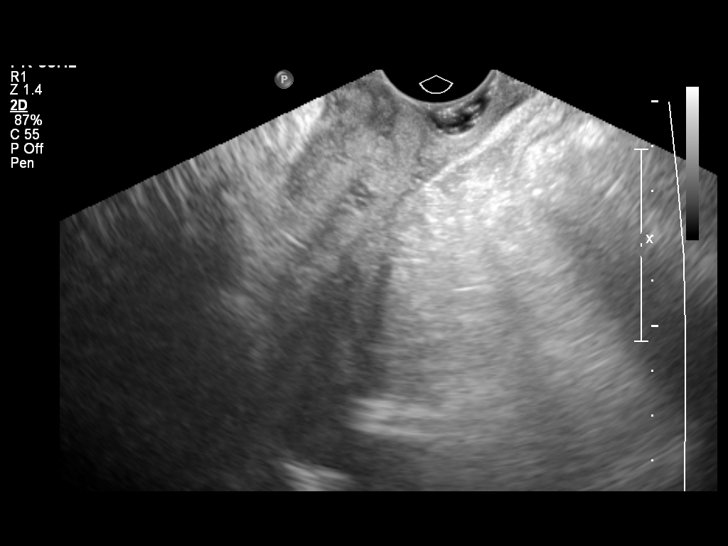
[im 31/34]
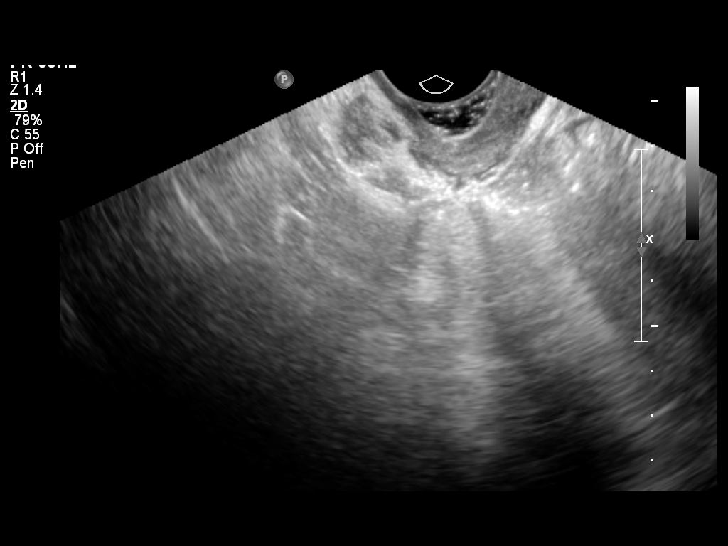
[im 34/34]
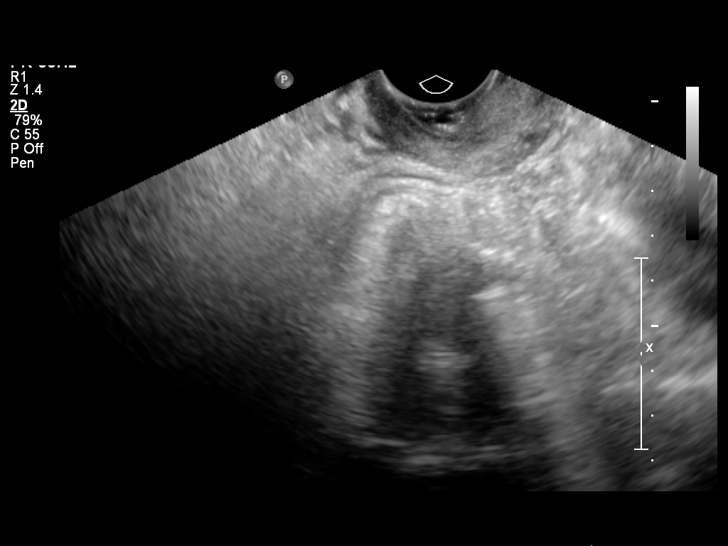

[13 of 25 positions shown; findings below may reference images not displayed]

FINDINGS: Uterus

Measurements: 8.2 x 3.5 x 4.8 cm. No fibroids or other mass
visualized.

Endometrium

Thickness: 0.3 cm.  No focal abnormality visualized.

Right ovary

Measurements: 6.5 x 4.1 x 4.9 cm. A complex cyst is noted at the
right ovary, measuring 5.1 x 3.2 x 4.6 cm, with partial organization
and a lace-like pattern, compatible with a hemorrhagic cyst. Limited
Doppler evaluation demonstrates normal color Doppler blood flow with
regard to the normal right ovarian tissue.

Left ovary

Measurements: 2.1 x 1.2 x 1.6 cm. Normal appearance/no adnexal mass.

Other findings

No free fluid is seen within the pelvic cul-de-sac. The transvaginal
evaluation is limited due to pain during the examination.
IMPRESSION: 1. 5.1 cm right adnexal hemorrhagic cyst noted.
2. Otherwise unremarkable pelvic ultrasound. No evidence for ovarian
torsion.

## 2020-08-08 ENCOUNTER — Other Ambulatory Visit: Payer: Self-pay | Admitting: Physician Assistant

## 2020-08-08 DIAGNOSIS — R110 Nausea: Secondary | ICD-10-CM

## 2020-08-08 DIAGNOSIS — R1011 Right upper quadrant pain: Secondary | ICD-10-CM

## 2022-10-08 NOTE — Progress Notes (Deleted)
NEW PATIENT Date of Service/Encounter:  10/08/22 Referring provider: none-self referred Primary care provider: No primary care provider on file.  Subjective:  Desiree Blake is a 47 y.o. female with a PMHx of menorrhagia presenting today for evaluation of "allergies". History obtained from: chart review and {Persons; PED relatives w/patient:19415::"patient"}.   Chronic rhinitis: started *** Symptoms include: {Blank multiple:19196:a:"***","nasal congestion","rhinorrhea","post nasal drainage","sneezing","watery eyes","itchy eyes","itchy nose"}  Occurs {Blank single:19197::"year-round","seasonally-***","year-round with seasonal flares","***"} Potential triggers: *** Treatments tried: *** Previous allergy testing: {Blank single:19197::"yes","no"} History of reflux/heartburn: {Blank single:19197::"yes","no"} Previous sinus, ear, tonsil, adenoid surgeries: *** Previous sinus imaging: ***  Other allergy screening: Asthma: {Blank single:19197::"yes","no"} Rhino conjunctivitis: {Blank single:19197::"yes","no"} Food allergy: {Blank single:19197::"yes","no"} Medication allergy: {Blank single:19197::"yes","no"} Hymenoptera allergy: {Blank single:19197::"yes","no"} Urticaria: {Blank single:19197::"yes","no"} Eczema:{Blank single:19197::"yes","no"} History of recurrent infections suggestive of immunodeficency: {Blank single:19197::"yes","no"} ***Vaccinations are up to date.   Past Medical History: Past Medical History:  Diagnosis Date   Menorrhagia    Ovarian cyst    Medication List:  Current Outpatient Medications  Medication Sig Dispense Refill   albuterol (PROVENTIL HFA;VENTOLIN HFA) 108 (90 BASE) MCG/ACT inhaler Inhale 2 puffs into the lungs every 6 (six) hours as needed for wheezing or shortness of breath.     megestrol (MEGACE) 20 MG tablet Take 2 tablets (40 mg total) by mouth 2 (two) times daily. Can increase to two tablets three times a day in the event of heavy bleeding  (Patient not taking: Reported on 10/12/2014) 60 tablet 2   ondansetron (ZOFRAN) 4 MG tablet Take 1 tablet (4 mg total) by mouth every 6 (six) hours. (Patient not taking: Reported on 10/12/2014) 15 tablet 0   oxyCODONE-acetaminophen (PERCOCET/ROXICET) 5-325 MG per tablet Take 1-2 tablets by mouth every 6 (six) hours as needed for severe pain. 15 tablet 0   triamcinolone cream (KENALOG) 0.1 % Apply 1 application topically 2 (two) times daily. 30 g 5   No current facility-administered medications for this visit.   Known Allergies:  No Known Allergies Past Surgical History: Past Surgical History:  Procedure Laterality Date   CESAREAN SECTION  2014   LAPAROSCOPIC ABDOMINAL EXPLORATION     ONE  TWIN WAS ECTOPIC   Family History: Family History  Problem Relation Age of Onset   Diabetes Mother    Cancer Mother        endometrial   Hypertension Father    Urolithiasis Father    Social History: Desiree Blake lives ***.   ROS:  All other systems negative except as noted per HPI.  Objective:  There were no vitals taken for this visit. There is no height or weight on file to calculate BMI. Physical Exam:  General Appearance:  Alert, cooperative, no distress, appears stated age  Head:  Normocephalic, without obvious abnormality, atraumatic  Eyes:  Conjunctiva clear, EOM's intact  Nose: Nares normal, {Blank multiple:19196:a:"***","hypertrophic turbinates","normal mucosa","no visible anterior polyps","septum midline"}  Throat: Lips, tongue normal; teeth and gums normal, {Blank multiple:19196:a:"***","normal posterior oropharynx","tonsils 2+","tonsils 3+","no tonsillar exudate","+ cobblestoning"}  Neck: Supple, symmetrical  Lungs:   {Blank multiple:19196:a:"***","clear to auscultation bilaterally","end-expiratory wheezing","wheezing throughout"}, Respirations unlabored, {Blank multiple:19196:a:"***","no coughing","intermittent dry coughing"}  Heart:  {Blank multiple:19196:a:"***","regular rate and  rhythm","no murmur"}, Appears well perfused  Extremities: No edema  Skin: {Blank multiple:19196:a:"***","Skin color, texture, turgor normal","no rashes or lesions on visualized portions of skin"}  Neurologic: No gross deficits     Diagnostics: Spirometry:  Tracings reviewed. Her effort: {Blank single:19197::"Good reproducible efforts.","It was hard to get consistent efforts and there is a question as to whether this reflects  a maximal maneuver.","Poor effort, data can not be interpreted.","Variable effort-results affected.","decent for first attempt at spirometry."} FVC: ***L (pre), ***L  (post) FEV1: ***L, ***% predicted (pre), ***L, ***% predicted (post) FEV1/FVC ratio: *** (pre), *** (post) Interpretation: {Blank single:19197::"Spirometry consistent with mild obstructive disease","Spirometry consistent with moderate obstructive disease","Spirometry consistent with severe obstructive disease","Spirometry consistent with possible restrictive disease","Spirometry consistent with mixed obstructive and restrictive disease","Spirometry uninterpretable due to technique","Spirometry consistent with normal pattern","No overt abnormalities noted given today's efforts"} with *** bronchodilator response  Skin Testing: {Blank single:19197::"Select foods","Environmental allergy panel","Environmental allergy panel and select foods","Food allergy panel","None","Deferred due to recent antihistamines use"}. *** Adequate controls. Results discussed with patient/family.   {Blank single:19197::"Allergy testing results were read and interpreted by myself, documented by clinical staff."," "}  Assessment and Plan  ***  {Blank single:19197::"This note in its entirety was forwarded to the Provider who requested this consultation."}  Thank you for your kind referral. I appreciate the opportunity to take part in Jasara's care. Please do not hesitate to contact me with questions.***  Sincerely,  Tonny Bollman,  MD [facility]

## 2022-10-10 ENCOUNTER — Ambulatory Visit: Payer: Self-pay | Admitting: Internal Medicine
